# Patient Record
Sex: Male | Born: 1995 | Race: Asian | Hispanic: No | Marital: Single | Smoking: Never smoker
Health system: Southern US, Community
[De-identification: ages and names within clinical notes are randomized; demographics above are authoritative.]

## PROBLEM LIST (undated history)

## (undated) DIAGNOSIS — R011 Cardiac murmur, unspecified: Secondary | ICD-10-CM

## (undated) HISTORY — PX: TONSILLECTOMY: SUR1361

## (undated) HISTORY — DX: Cardiac murmur, unspecified: R01.1

---
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
## 2021-04-24 ENCOUNTER — Encounter (HOSPITAL_COMMUNITY): Payer: Self-pay

## 2021-04-24 ENCOUNTER — Other Ambulatory Visit: Payer: Self-pay

## 2021-04-24 ENCOUNTER — Emergency Department (HOSPITAL_COMMUNITY): Payer: No Typology Code available for payment source

## 2021-04-24 ENCOUNTER — Emergency Department (HOSPITAL_COMMUNITY)
Admission: EM | Admit: 2021-04-24 | Discharge: 2021-04-24 | Disposition: A | Discharge status: Home / Self Care | Payer: No Typology Code available for payment source | Attending: Emergency Medicine | Admitting: Emergency Medicine

## 2021-04-24 DIAGNOSIS — Y92410 Unspecified street and highway as the place of occurrence of the external cause: Secondary | ICD-10-CM | POA: Insufficient documentation

## 2021-04-24 DIAGNOSIS — R0781 Pleurodynia: Secondary | ICD-10-CM | POA: Insufficient documentation
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
## 2021-04-24 NOTE — Discharge Instructions (Addendum)
Your exam and imaging look reassuring.  I suspect you may have bruised rib.  I recommend over-the-counter pain medication like ibuprofen and or Tylenol every 6 hours as needed please follow dosing on the back of bottle.  If pain continues after a week's time I would like to follow-up with your PCP for further evaluation given contact info above please call  Come back to the emergency department if you develop chest pain, shortness of breath, severe abdominal pain, uncontrolled nausea, vomiting, diarrhea.
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
